# Patient Record
Sex: Female | Born: 1990 | Race: White | Marital: Single | Smoking: Never smoker
Health system: Northeastern US, Academic
[De-identification: ages and names within clinical notes are randomized; demographics above are authoritative.]

## PROBLEM LIST (undated history)

## (undated) DIAGNOSIS — J45909 Unspecified asthma, uncomplicated: Secondary | ICD-10-CM

## (undated) DIAGNOSIS — B009 Herpesviral infection, unspecified: Secondary | ICD-10-CM

## (undated) HISTORY — DX: Herpesviral infection, unspecified: B00.9

## (undated) HISTORY — DX: Unspecified asthma, uncomplicated: J45.909

---

## 2013-09-03 ENCOUNTER — Emergency Department
Admit: 2013-09-03 | Discharge: 2013-09-03 | Disposition: A | Discharge status: Home / Self Care | Payer: Self-pay | Source: Ambulatory Visit | Attending: Family Medicine | Admitting: Family Medicine

## 2013-09-03 DIAGNOSIS — S62309A Unspecified fracture of unspecified metacarpal bone, initial encounter for closed fracture: Secondary | ICD-10-CM

## 2013-09-03 LAB — POCT URINE PREGNANCY: Lot #: 700939

## 2013-09-03 LAB — HM HIV SCREENING OFFERED

## 2013-09-03 MED ORDER — IBUPROFEN 200 MG PO TABS *I*
800.0000 mg | ORAL_TABLET | Freq: Once | ORAL | Status: AC
Start: 2013-09-03 — End: 2013-09-03
  Administered 2013-09-03: 800 mg via ORAL
  Filled 2013-09-03: qty 4

## 2013-09-03 MED ORDER — IBUPROFEN 800 MG PO TABS *I*
800.0000 mg | ORAL_TABLET | Freq: Three times a day (TID) | ORAL | Status: DC | PRN
Start: 2013-09-03 — End: 2018-03-01

## 2013-09-03 NOTE — ED Provider Notes (Addendum)
History     Chief Complaint   Patient presents with   . Wrist Injury     HPI Comments: Larey Seat in dorm room last night. GLF after patient tripped. No head struck or LOC. Patient now c/o right wrist pain.patient took ibuprofen 7 hours PTA. Patient denies numbness or weakness of RUE.      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         No past surgical history on file.    No family history on file.      Social History      reports that she does not drink alcohol or use illicit drugs. Her tobacco and sexual activity histories are not on file.    Living Situation    Questions Responses    Patient lives with Other(comment)    Homeless No    Caregiver for other family member No    External Services None    Employment Student    Domestic Violence Risk           Problem List     There is no problem list on file for this patient.      Review of Systems   Review of Systems   Constitutional: Negative.    HENT: Negative.    Eyes: Negative.    Respiratory: Negative.    Cardiovascular: Negative.    Gastrointestinal: Negative.    Endocrine: Negative.    Musculoskeletal: Positive for joint swelling and arthralgias. Negative for myalgias, back pain and gait problem.   Skin: Negative.    Allergic/Immunologic: Negative.    Neurological: Negative.    Hematological: Negative.    Psychiatric/Behavioral: Negative.        Physical Exam     ED Triage Vitals   BP Heart Rate Heart Rate(via Pulse Ox) Resp Temp Temp Source SpO2 O2 Device O2 Flow Rate   09/03/13 1117 09/03/13 1117 -- 09/03/13 1117 09/03/13 1117 09/03/13 1117 09/03/13 1117 09/03/13 1117 --   116/74 mmHg 86  16 36.5 C (97.7 F) TEMPORAL 97 % None (Room air)       Weight           09/03/13 1117           58.06 kg (128 lb)               Physical Exam   Nursing note and vitals reviewed.  Constitutional: She is oriented to person, place, and time.  She appears well-developed and well-nourished. She appears distressed.   In painful distress.   HENT:   Head: Normocephalic and atraumatic.   Mouth/Throat: Oropharynx is clear and moist.   Eyes: Conjunctivae and EOM are normal. Pupils are equal, round, and reactive to light.   Neck: Normal range of motion. Neck supple.   Cardiovascular: Normal rate, regular rhythm, normal heart sounds and intact distal pulses.    Pulmonary/Chest: Effort normal and breath sounds normal.   Abdominal: Soft. Bowel sounds are normal.   Musculoskeletal: She exhibits tenderness. She exhibits no edema.   AROM normal at right shoulder,right elbow.  Decreased AROM at right wrist due to pain.  Right wrist swelling, no deformity, no snuffbox TTP. Cap refill <2sec. RUE neurovascularly intact.   Neurological: She is alert and oriented to person, place, and time. She has normal reflexes.  She displays normal reflexes. She exhibits normal muscle tone.   Skin: Skin is warm and dry. No rash noted. She is not diaphoretic. No erythema. No pallor.   Psychiatric: She has a normal mood and affect. Her behavior is normal. Judgment and thought content normal.       Medical Decision Making      Amount and/or Complexity of Data Reviewed  Tests in the radiology section of CPT: ordered and reviewed        Initial Evaluation:  ED First Provider Contact    Date/Time Event User Comments    09/03/13 1124 ED Provider First Contact Michaele Amundson Initial Face to Face Provider Contact          Patient seen by me as above    Assessment:  22 y.o., female comes to the ED s/p fall in dorm room last night. GLF after patient tripped. No head struck or LOC. Patient now c/o right wrist pain.patient took ibuprofen 7 hours PTA. Patient denies numbness or weakness of RUE.      Differential Diagnosis includes right fifth metacarpal fracture.       ulnar gutter splint applied. RUE neurovascularly intact post procedure.       Plan: diagnostics, analgesics, splinting.      Cruzita Lederer, MD           Cruzita Lederer, MD  09/03/13 1319    Cruzita Lederer, MD  09/03/13 1319    Cruzita Lederer, MD  09/03/13 1320

## 2013-09-03 NOTE — Discharge Instructions (Signed)
Please keep the splint intact and dry until follow up with orthopedic surgeon.  Please keep the splint on until follow up with orthopedic surgeon.

## 2013-09-03 NOTE — ED Notes (Signed)
Pt with c/o rt wrist pain.  Pt fell on tiled flooring last night landing on arm.  Splint intact on arrival ice applied last night took ibuprofen for pain at 0400 today 400mg .

## 2013-09-07 ENCOUNTER — Encounter: Payer: Self-pay | Admitting: Gastroenterology

## 2013-09-07 ENCOUNTER — Ambulatory Visit: Payer: Self-pay | Admitting: Orthopedic Surgery

## 2013-09-07 ENCOUNTER — Encounter: Payer: Self-pay | Admitting: Orthopedic Surgery

## 2013-09-07 VITALS — BP 116/70 | Ht 61.5 in | Wt 134.0 lb

## 2013-09-07 DIAGNOSIS — S92353A Displaced fracture of fifth metatarsal bone, unspecified foot, initial encounter for closed fracture: Secondary | ICD-10-CM

## 2013-09-11 NOTE — Progress Notes (Addendum)
PATIENTEUDELIA, CAHOON  MR #:  [id number]   ACCOUNT #:  [id number] DOB:  1991-01-31   DICTATED BY:  Neomia Dear, MD DATE OF VISIT:  09/07/2013     CHIEF COMPLAINT:  Hand pain.    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  Seen in the emergency room.  Imaging demonstrating a fracture of the 5th metacarpal without significant angulation or translation, and she comes to clinic today in a splint.     Past medical history, medications, allergies, past surgical history, social history, family history, review of systems, please see new patient questionnaire reviewed today.    PHYSICAL EXAMINATION:  Well-appearing young female in no acute distress.  Examination of her hand demonstrates tenderness to palpation over the distal aspect of the 5th metacarpal.  No pain about her wrist.  .  Skin is intact.  Brisk capillary refill in all her fingers.  Sensation to light touch is intact throughout her fingers.  No rotational deformity about her small finger.    IMAGING:  Minimally displaced distal 5th metacarpal fracture.    PLAN:  Boxer's cast.  Follow up 4 weeks.             ______________________________  Neomia Dear, MD    JCG/MODL  DD:  09/11/2013 06:34:24  DT:  09/11/2013 06:44:35  Job #:  [id number]    cc: Neomia Dear, MD

## 2013-10-19 ENCOUNTER — Encounter: Payer: Self-pay | Admitting: Orthopedic Surgery

## 2013-10-19 ENCOUNTER — Ambulatory Visit: Payer: Self-pay | Admitting: Orthopedic Surgery

## 2013-10-19 VITALS — Ht 61.5 in | Wt 134.0 lb

## 2013-10-19 DIAGNOSIS — S62309A Unspecified fracture of unspecified metacarpal bone, initial encounter for closed fracture: Secondary | ICD-10-CM

## 2013-11-11 NOTE — Progress Notes (Signed)
CHIEF COMPLAINT: Hand pain.   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 Cast removed today.  Past medical history, medications, allergies, past surgical history, social history, family history, review of systems. No change    PHYSICAL EXAMINATION: Well-appearing young female in no acute distress. Examination of her hand demonstrates no tenderness to palpation over the distal aspect of the 5th metacarpal. No pain about her wrist. . Skin is intact. Brisk capillary refill in all her fingers. Sensation to light touch is intact throughout her fingers. No rotational deformity about her small finger.     IMAGING: Minimally displaced distal 5th metacarpal fracture with interval healing.    PLAN: Discontinue cast. Return to activities as tolerated. If she notes and prolonged pain or stiffness she will contact my office. Follow up as needed.

## 2013-11-16 ENCOUNTER — Ambulatory Visit: Payer: Self-pay | Admitting: Orthopedic Surgery

## 2016-04-26 ENCOUNTER — Ambulatory Visit
Admission: AD | Admit: 2016-04-26 | Discharge: 2016-04-26 | Disposition: A | Discharge status: Home / Self Care | Payer: Self-pay

## 2016-04-26 DIAGNOSIS — N390 Urinary tract infection, site not specified: Secondary | ICD-10-CM

## 2016-04-26 LAB — POCT URINALYSIS DIPSTICK
Bilirubin,Ur: NEGATIVE
Glucose,UA POCT: NORMAL
Ketones,UA POCT: NEGATIVE
Leuk Esterase,UA POCT: 2 — AB
Lot #: 16608901
Nitrite,UA POCT: NEGATIVE
PH,UA POCT: 5 (ref 5–8)
Specific gravity,UA POCT: 1.02 (ref 1.002–1.03)
Urobilinogen,UA: NORMAL

## 2016-04-26 LAB — POCT URINE PREGNANCY: Lot #: 170601

## 2016-04-26 MED ORDER — NITROFURANTOIN MONOHYD MACRO 100 MG PO CAPS *I*
100.0000 mg | ORAL_CAPSULE | Freq: Two times a day (BID) | ORAL | 0 refills | Status: AC
Start: 2016-04-26 — End: 2016-05-01

## 2016-04-26 NOTE — Discharge Instructions (Signed)
You may take Tylenol 650 mg every 4-6 hours as needed for pain/fever.  Maximum daily dose = 3000 mg.  You may use over-the-counter AZO for dysuria relief.  If you were prescribed antibiotics, take them exactly as your caregiver instructs you. Finish the medication even if you feel better after you have only taken some of the medication.  Drink enough water and fluids to keep your urine clear or pale yellow, adding cranberry juice to your fluid intake to help eradicate urinary infection.  Avoid caffeine, tea, and carbonated beverages. They tend to irritate your bladder.  Empty your bladder often. Avoid holding urine for long periods of time.  Empty your bladder before and after sexual intercourse.  After a bowel movement, women should cleanse from front to back. Use each tissue only once.  Please consume 1-2 OTC probiotic yogurt products daily while on this extended antibiotic regimen to help prevent antibiotic-associated yeast infection

## 2016-04-26 NOTE — ED Triage Notes (Signed)
Patient presents with urinary frequency and dysuria x 1 day.    Triage Note   Jensine Luz, RN

## 2016-04-26 NOTE — UC Provider Note (Signed)
History     Chief Complaint   Patient presents with    Urinary Frequency     Patient presents with urinary frequency and dysuria x 1 day.      HPI Comments: Patient is a 25 year old female presents with UTI symptoms 1 day.  She states she's been having burning dysuria with a 1 day, and mild superpubic pressure/discomfort.  She denies any fever/chills, nausea/vomiting/diarrhea, genital rash, vaginal pain/odor/discharge, back pain.  She states she noticed scant hematuria today when she urinated.  Her LMP was last week.      History provided by:  Patient  Language interpreter used: No    Is this ED visit related to civilian activity for income:  Not work related      History reviewed. No pertinent past medical history.         History reviewed. No pertinent surgical history.    History reviewed. No pertinent family history.      Social History    reports that she has never smoked. She has never used smokeless tobacco. She reports that she does not drink alcohol or use illicit drugs. Her sexual activity history is not on file.    Living Situation     Questions Responses    Patient lives with Other(comment)    Homeless No    Caregiver for other family member No    External Services None    Employment Student    Domestic Violence Risk           Review of Systems   Review of Systems   Constitutional: Negative for activity change, appetite change, chills, fatigue and fever.   Respiratory: Negative for shortness of breath.    Cardiovascular: Negative for chest pain.   Gastrointestinal: Positive for abdominal pain. Negative for diarrhea, nausea and vomiting.   Genitourinary: Positive for dysuria, frequency, hematuria and urgency. Negative for difficulty urinating, dyspareunia, flank pain, genital sores, pelvic pain, vaginal bleeding, vaginal discharge and vaginal pain.   Musculoskeletal: Negative for back pain and myalgias.   Skin: Negative for rash.   Neurological: Negative for dizziness and light-headedness.    Hematological: Negative for adenopathy.       Physical Exam     ED Triage Vitals   BP Heart Rate Heart Rate (via Pulse Ox) Resp Temp Temp src SpO2 O2 Device O2 Flow Rate   04/26/16 2022 04/26/16 2022 04/26/16 2022 04/26/16 2022 04/26/16 2022 04/26/16 2022 04/26/16 2022 04/26/16 2022 --   122/77 78 78 18 36.8 C (98.2 F) Tympanic 99 % None (Room air)       Weight           --                               Physical Exam   Constitutional: She is oriented to person, place, and time. She appears well-developed and well-nourished. She does not appear ill. No distress.   HENT:   Head: Normocephalic and atraumatic.   Neck: Normal range of motion. Neck supple.   Cardiovascular: Normal rate, regular rhythm, normal heart sounds and intact distal pulses.    Pulmonary/Chest: Effort normal and breath sounds normal. No respiratory distress.   Abdominal: Soft. Normal appearance and bowel sounds are normal. She exhibits no distension. There is no hepatosplenomegaly. There is tenderness (dull discomfort) in the suprapubic area. There is no rigidity, no rebound, no guarding and no CVA tenderness.  Musculoskeletal: Normal range of motion.   Neurological: She is alert and oriented to person, place, and time.   Skin: Skin is warm and dry.   Psychiatric: She has a normal mood and affect.   Nursing note and vitals reviewed.       Medical Decision Making      Amount and/or Complexity of Data Reviewed  Clinical lab tests: ordered and reviewed        Initial Evaluation:  ED First Provider Contact     Date/Time Event User Comments    04/26/16 2026 ED Provider First Contact Yonas Bunda Initial Face to Face Provider Contact          Patient seen by me on arrival date of 04/26/2016 at at time of arrival  8:05 PM.  Initial face to face evaluation time noted above may be discrepant due to patient acuity and delay in documentation.    Assessment:  25 y.o.female [facility] with UTI symptoms 1 day.  Patient is afebrile with  stable vital signs at clinic.    Differential Diagnosis includes   Lower Urinary Tract Infection  Acute cystitis  Acute Pyelonephritis  Hyperactive bladder with Urethral irritation  Acute Nephrolithiasis                  Plan:   Orders Placed This Encounter    Urine Culture    POCT urinalysis dipstick    POCT urine pregnancy    nitrofurantoin monohydrate macrocrystal (MACROBID) 100 MG capsule       Recent Results (from the past 24 hour(s))   POCT urinalysis dipstick    Collection Time: 04/26/16  8:26 PM   Result Value Ref Range    Specific gravity,UA POCT 1.020 1.002 - 1.03    PH,UA POCT 5.0 5 - 8    Leuk Esterase,UA POCT +2 (!) Negative    Nitrite,UA POCT Negative Negative    Protein,UA POCT Trace (!) Negative mg/dL    Glucose,UA POCT Normal Normal    Ketones,UA POCT Negative Negative    Urobilinogen,UA Normal     Bilirubin,Ur Negative Negative    Blood,UA POCT About 250 (!) Negative    Exp date 12-2016     Lot # [id number]    POCT urine pregnancy    Collection Time: 04/26/16  8:56 PM   Result Value Ref Range    Preg Test,UR POC  Negative-Dilute urine specimens may cause false negative urine pregnancy results...     Negative-Dilute urine specimens may cause false negative urine pregnancy results...    INTERNAL CONTROL POCT URINE PREGNANCY *Yes-internal procedural control(s) acceptable     Exp date 07-2017     Lot # [id number]        Pregnancy - Urine Pregnancy test ordered to guide diagnostics and/or medication therapy in patient of child bearing age    Urine culture ordered and sent to lab.      Final Diagnosis    ICD-10-CM ICD-9-CM   1. Urinary tract infection, site unspecified N39.0      URINARY TRACT INFECTION, ADULT (ENGLISH) View    Patient's Language: English   Instructions      You may take Tylenol 650 mg every 4-6 hours as needed for pain/fever. Maximum daily dose = 3000 mg.  You may use over-the-counter AZO for dysuria relief.  If you were prescribed antibiotics, take them exactly as your caregiver instructs  you. Finish the medication even if you feel better after you have  only taken some of the medication.  Drink enough water and fluids to keep your urine clear or pale yellow, adding cranberry juice to your fluid intake to help eradicate urinary infection.  Avoid caffeine, tea, and carbonated beverages. They tend to irritate your bladder.  Empty your bladder often. Avoid holding urine for long periods of time.  Empty your bladder before and after sexual intercourse.  After a bowel movement, women should cleanse from front to back. Use each tissue only once.  Please consume 1-2 OTC probiotic yogurt products daily while on this extended antibiotic regimen to help prevent antibiotic-associated yeast infection          Encourage fluids, encourage rest, good hand hygiene.    Use over the counter medications as discussed.    Please start the new medications as below:    Current Discharge Medication List      New Medications    Details Last Dose Given Next Dose Due Script Given?   nitrofurantoin monohydrate macrocrystal (MACROBID) 100 mg Dose: 100 mg  Take 100 mg by mouth 2 times daily    Quantity 10 capsule, Refill 0  Start date: 04/26/2016, End date: 05/01/2016       Comments: Emergency Encounter                   Please follow up with your physician as below:    Follow-up Information     Follow up with Provider, None. Go in 1 week.    Why:  If symptoms worsen or persist/do not improve, For further   evaluation and treatment    Contact information:    [address]  [address]  [place] [address]          If short of breath, chest pains or any other concerns please report to the emergency room.    In the event of an Emergency dial 911.      Final Diagnosis  Final diagnoses:   [N39.0] Urinary tract infection, site unspecified (Primary)           Merrily Brittle, PA    Supervising physician Dr. Delbert Harness   was immediately available     Merrily Brittle, [place]  04/26/16 2058

## 2016-05-01 LAB — AEROBIC CULTURE

## 2017-02-02 ENCOUNTER — Ambulatory Visit
Admission: AD | Admit: 2017-02-02 | Discharge: 2017-02-02 | Disposition: A | Discharge status: Home / Self Care | Payer: PRIVATE HEALTH INSURANCE | Source: Ambulatory Visit | Attending: Emergency Medicine | Admitting: Emergency Medicine

## 2017-02-02 DIAGNOSIS — M545 Low back pain, unspecified: Secondary | ICD-10-CM

## 2017-02-02 DIAGNOSIS — R319 Hematuria, unspecified: Secondary | ICD-10-CM | POA: Insufficient documentation

## 2017-02-02 DIAGNOSIS — Z3202 Encounter for pregnancy test, result negative: Secondary | ICD-10-CM

## 2017-02-02 DIAGNOSIS — R140 Abdominal distension (gaseous): Secondary | ICD-10-CM | POA: Insufficient documentation

## 2017-02-02 LAB — POCT URINALYSIS DIPSTICK
Bilirubin,Ur: NEGATIVE
Glucose,UA POCT: NORMAL mg/dL
Ketones,UA POCT: NEGATIVE mg/dL
Leuk Esterase,UA POCT: NEGATIVE
Lot #: 22262702
Nitrite,UA POCT: NEGATIVE
PH,UA POCT: 8 (ref 5–8)
Protein,UA POCT: NEGATIVE mg/dL
Specific gravity,UA POCT: 1 (ref 1.002–1.030)
Urobilinogen,UA: NORMAL mg/dL

## 2017-02-02 LAB — POCT URINE PREGNANCY: Lot #: 171461

## 2017-02-02 LAB — PREGNANCY TEST, SERUM: Preg,Serum: NEGATIVE

## 2017-02-02 NOTE — ED Notes (Signed)
Reviewed discharge paperwork with patient. All questioned answered, patient verbalized understanding. Patient left with all belongings.

## 2017-02-02 NOTE — Discharge Instructions (Signed)
Urine culture ordered and sent to lab.  Drink plenty of fluids, monitor symptoms for changes.   Apply heat to low back.  Any worsening of symptoms go directly to ED for further evaluation and treatment.    PLEASE REVIEW ALL INSTRUCTIONS FOR DETAILS AND CONTENT CONTAINED IN THE DISCHARGE MATERIALS NOT COVERED AT DISCHARGE.    Thank you Mackenzie Daniels for coming to [facility] for your health care concerns.    If your condition changes and/or worsens please follow up with your primary doctor and/or return to the urgent care center.    In the event of an emergency please dial 911.

## 2017-02-02 NOTE — UC Provider Note (Signed)
History     Chief Complaint   Patient presents with    Hematuria     Pt presents with two day history of lower back pain and then hematuria. Also reports bloating and abdominal pain.     Back Pain    Abdominal Pain     HPI Comments: 26 year old female presents for evaluation of lower back pain and hematuria which started 2 days ago.  Patient also reports abdominal bloating and discomfort across her entire lower abdomen.  She reports mild nausea but no vomiting.  She is also had mild diarrhea over the past 2 days.  She denies any symptoms of dysuria, increased frequency or urgency.  No systemic symptoms such as fever, chills, or malaise.  Denies possibility pregnancy and states she had her last period March 10th.  She states she is usually pretty regular.  Patient had a past pregnancy at the age of 3 which was terminated.      History provided by:  Patient      History reviewed. No pertinent past medical history.         History reviewed. No pertinent surgical history.    History reviewed. No pertinent family history.      Social History    reports that she has never smoked. She has never used smokeless tobacco. She reports that she does not drink alcohol or use illicit drugs. Her sexual activity history is not on file.    Living Situation     Questions Responses    Patient lives with Other(comment)    Homeless No    Caregiver for other family member No    External Services None    Employment Student    Domestic Violence Risk           Review of Systems   Review of Systems   Constitutional: Negative for activity change, chills, fatigue and fever.   Respiratory: Negative for cough and shortness of breath.    Cardiovascular: Negative for chest pain.   Gastrointestinal: Positive for abdominal distention (bloated feeling), abdominal pain, diarrhea and nausea. Negative for constipation and vomiting.   Genitourinary: Positive for difficulty urinating and hematuria. Negative for decreased urine volume, dysuria,  flank pain, frequency, menstrual problem, pelvic pain, urgency, vaginal bleeding, vaginal discharge and vaginal pain.   Musculoskeletal: Positive for back pain.   Skin: Negative for rash.   Neurological: Negative for dizziness, light-headedness and headaches.   Psychiatric/Behavioral: Negative for agitation and behavioral problems.       Physical Exam       ED Triage Vitals   BP Heart Rate Heart Rate (via Pulse Ox) Resp Temp Temp src SpO2 (Retired) O2 Device O2 Flow Rate   02/02/17 1130 02/02/17 1130 -- 02/02/17 1130 02/02/17 1130 02/02/17 1130 02/02/17 1130 -- --   119/73 67  18 36.7 C (98.1 F) TEMPORAL 100 %        Weight           --                               Physical Exam   Constitutional: She is oriented to person, place, and time. She appears well-developed and well-nourished.   HENT:   Head: Normocephalic and atraumatic.   Eyes: Conjunctivae are normal.   Cardiovascular: Normal heart sounds.    Pulmonary/Chest: Effort normal.   Abdominal: Soft. Bowel sounds are normal. She exhibits no distension and  no mass. There is no hepatosplenomegaly. There is tenderness in the right lower quadrant, suprapubic area and left lower quadrant. There is no rigidity, no rebound, no guarding, no CVA tenderness, no tenderness at McBurney's point and negative Murphy's sign. No hernia.   Musculoskeletal: Normal range of motion.   Neurological: She is alert and oriented to person, place, and time.   Skin: Skin is warm and dry.   Psychiatric: She has a normal mood and affect. Her behavior is normal. Judgment and thought content normal.   Nursing note and vitals reviewed.       Medical Decision Making      Amount and/or Complexity of Data Reviewed  Clinical lab tests: ordered and reviewed      Pregnancy - Urine Pregnancy test ordered to guide diagnostics and/or medication therapy in patient of child bearing age    Initial Evaluation:  ED First Provider Contact     Date/Time Event User Comments    02/02/17 1134 ED First Provider  Contact Alroy Bailiff Initial Face to Face Provider Contact          Patient was seen on: 02/02/2017        Assessment:  25 y.o.female comes to the Urgent Care Center with low back pain and hematuria 2 days.  Hematuria has resolved however she continues to have diffuse discomfort across the lower back.  Patient also feels abdominal discomfort and bloating in the lower quadrants.  Urinalysis shows trace blood only.    Negative pregnancy however dilute sample.  We will perform serum hCG test.   Differential Diagnosis includes pregnancy  Lower Urinary Tract Infection  Acute cystitis  Acute Pyelonephritis  Hyperactive bladder with Urethral irritation  Acute Nephrolithiasis                    Plan: Dipstick urinalysis, urine culture, pregnancy test, serum hCG test.  Patient referred to [facility] for further evaluation and treatment.  Orders Placed This Encounter    Bacterial urine culture    HCG, serum qualitative, pregnancy    ED/UC REFERRAL TO OBGYN    POCT urinalysis dipstick    POCT urine pregnancy       Recent Results (from the past 24 hour(s))   POCT urine pregnancy    Collection Time: 02/02/17 11:24 AM   Result Value Ref Range    Preg Test,UR POC  Negative-Dilute urine specimens may cause false negative urine pregnancy results...     Negative-Dilute urine specimens may cause false negative urine pregnancy results...    INTERNAL CONTROL POCT URINE PREGNANCY *Yes-internal procedural control(s) acceptable     Exp date 12/2017     Lot # [id number]    POCT urinalysis dipstick    Collection Time: 02/02/17 11:25 AM   Result Value Ref Range    Specific gravity,UA POCT 1.000 1.002 - 1.030    PH,UA POCT 8.0 5 - 8    Leuk Esterase,UA POCT Negative Negative    Nitrite,UA POCT Negative Negative    Protein,UA POCT Negative Negative mg/dL    Glucose,UA POCT Normal Normal mg/dL    Ketones,UA POCT Negative Negative mg/dL    Urobilinogen,UA Normal Less than 1 mg/dL    Bilirubin,Ur Negative Negative    Blood,UA POCT Trace (!) Negative     Exp date 09/2017     Lot # [id number]            Final Diagnosis    ICD-10-CM ICD-9-CM   1. Acute low back pain  without sciatica, unspecified back pain laterality M54.5 724.2   2. Hematuria, unspecified type R31.9 599.70   3. Abdominal bloating R14.0 787.3       Encourage fluids, encourage rest, good hand hygiene.    Use over the counter medications as discussed.    Please start the new medications as below:    Current Discharge Medication List          Please follow up with your physician as below:    Follow-up Information     [facility]    Specialty:  Emergency Medicine    Why:  As needed, If symptoms worsen    Contact information:    [address]  [address]  [place] [id number]  [phone]        Thank you Ulice Dashicole Maggio for coming to [facility] for your health care concerns.    If your condition changes and/or worsens please follow up with her primary doctor and/or return to the urgent care center.    If short of breath, chest pains or any other concerns please report to the emergency room.    In the event of an Emergency dial 911.    Final Diagnosis  Final diagnoses:   [M54.5] Acute low back pain without sciatica, unspecified back pain laterality (Primary)   [R31.9] Hematuria, unspecified type   [R14.0] Abdominal bloating           Lonie PeakLINTON P Revere Maahs, PA    Supervising physician Harless LittenMichael Kamali, MD was immediately available     Olen PelOlsen, Amiya Escamilla Peter, PA  02/02/17 1158

## 2017-02-02 NOTE — ED Triage Notes (Signed)
Pt presents with two day history of lower back pain and then hematuria. Also reports bloating and abdominal pain.        Triage Note   Dedra SkeensLauren Virgene Tirone, RN

## 2017-02-03 ENCOUNTER — Telehealth: Payer: Self-pay

## 2017-02-03 LAB — AEROBIC CULTURE: Aerobic Culture: 0

## 2017-02-03 NOTE — Telephone Encounter (Signed)
Pt seen at Urgent care on 3/27 for low back pain and 1-2 days vaginal bleeding.  Pt was worked up for UTI and pregnancy. Serum pregnancy test negative.  Urine positive for trace blood, culture w <10,000/ml of normal flora.    Pt still having pain and bloating.  Managing with OTC tylenol/ibuprofen. Pt is at work and requesting a late the day appt to accommodate her work schedule.     [place] in [place] and does not have establish care providers in the area.     Appt booked for 3/30 at pt's request.      Pt with questions regarding insurance acceptance. Will route for [facility] to contact pt.

## 2017-02-03 NOTE — Telephone Encounter (Signed)
[facility] is requesting her lab results 1st before she schedules NPV/UC F/U.

## 2017-02-03 NOTE — Telephone Encounter (Signed)
Patient had called earlier regarding her test results, writer was able to call patient back. Two patient identifiers were used to verify patient identity. Patient was given her test results. She verbalized understanding and denies further questions/concerns at this time. Bonnee QuinKeri Anab Vivar, RN

## 2017-02-05 ENCOUNTER — Ambulatory Visit: Payer: PRIVATE HEALTH INSURANCE | Attending: Obstetrics and Gynecology | Admitting: Obstetrics and Gynecology

## 2017-02-05 ENCOUNTER — Encounter: Payer: Self-pay | Admitting: Obstetrics and Gynecology

## 2017-02-05 VITALS — BP 140/80 | HR 84 | Ht 61.0 in | Wt 169.0 lb

## 2017-02-05 DIAGNOSIS — Z01419 Encounter for gynecological examination (general) (routine) without abnormal findings: Secondary | ICD-10-CM | POA: Insufficient documentation

## 2017-02-05 DIAGNOSIS — Z113 Encounter for screening for infections with a predominantly sexual mode of transmission: Secondary | ICD-10-CM | POA: Insufficient documentation

## 2017-02-05 NOTE — Progress Notes (Signed)
Subjective:      Mackenzie Daniels is a 26 y.o. premenopausal female who presents for a new patient exam. She was seen in urgent care on 3/27 for 1 episode of vaginal spotting and lower back pain. She is in a monogamous relationship and uses condoms for birth control. The spotting concerned her. She tested negative for pregnancy and UTI at that time.  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 She is happy with this and wishes to continue. She is not interested in any form of Birth Control and will continue with Condoms.     GYN History  LMP: 01/14/2017  Menses:regular every month without intermenstrual spotting  Cramps: Moderate  Menarche: 26yo: [place]: 26yo  Last pap smear: Date: 2015 Results: no abnormalities  History of abnormal pap smear: No.   Sexually active: single partner, contraception - condoms: 100% of the time  Together with partner: 2 years  STD History: HSV, chlamydia  Current contraception: condoms: 100% of the time      Obstetric History    G1   P0   T0   P0   A1   L0     SAB0   TAB1   Ectopic0   Multiple0   Live Births0        Screening History:  Gyn screening history: last pap: was normal  The patient wears seatbelts:yes  The patient participates in regular exercise: no   Diet: is adequate  Has the patient ever been transfused or tattooed?:no  Lives with has a roommate  Denies depression.   Domestic violence:No    Mackenzie Daniels  has a past medical history of Asthma and Herpes simplex virus (HSV) infection.  Mackenzie Daniels  does not have a problem list on file.  Mackenzie Daniels  has no past surgical history on file.    Review of Systems  Pertinent items are noted in HPI.      Objective:      BP 140/80   Pulse 84   Ht 1.549 m ( )   Wt 76.7 kg (169 lb)   LMP 01/14/2017   BMI 31.93 kg/m2  General appearance: alert, appears stated age, cooperative, no distress and mildly obese  Breasts: normal appearance, no masses or tenderness  Abdomen: soft, non-tender; bowel sounds normal; no masses,  no  organomegaly and obese  Pelvic: cervix normal in appearance, external genitalia normal, no adnexal masses or tenderness, no cervical motion tenderness, rectovaginal septum normal, uterus normal size, shape, and consistency and vagina normal without discharge    Assessment:      Mackenzie Daniels is a 26 y.o. female who presents for an annual exam. She has the following concerns: 1) one episode of intermenstrual spotting 2) needs to establish care 3) STI screen 4) back pain     Plan:   PAP: today   Ok to try Ibuprofen  PO every 6-8 hours for lower back pain.    Preventative health: Healthy women's lifestyle handout., Breast self exam technique reviewed and patient encouraged to perform self-exam monthly., Nutrition., Calcium. and Multivitamins.  Sexual health and contraception: The risks and benefits of different contraceptive methods were discussed with the patient., A contraceptive plan and follow up was discussed and understood by the patient., Planned contraception: condoms: 100% of the time, The risks and prevention of STD's were reviewed. and Safer sex practices were reviewed.  Nutrition/Exercise reviewed: Weight loss counseling. and Counseled on regular exercise. Discussed duration, frequency, intensity, and  types of exercise.  Smoking cessation: N/A  Vaccinations: Influenza    To set up appt with Dr. Levada Schilling to establish Primary Care  Orders Placed This Encounter   Procedures    Trichomonas DNA amplification     Standing Status:   Future     Standing Expiration Date:   02/05/2017    Chlamydia plasmid DNA amplification    N. Gonorrhoeae DNA amplification    Hepatitis C antibody     Standing Status:   Future     Standing Expiration Date:   02/05/2018    Hepatitis B surface antigen     Standing Status:   Future     Standing Expiration Date:   02/05/2018    GYN Cytology     Order Specific Question:   Source     Answer:   Cervix     Order Specific Question:   Screening or diagnostic     Answer:   Screening      Order Specific Question:   Clinical history     Answer:   new patient     Order Specific Question:   Patient Pregnant     Answer:   No     Order Specific Question:   HPV Testing (requested on Liquid based pap test only)     Answer:   No HPV     Order Specific Question:   STD Testing (Liquid based PAP test only)     Answer:   No STD Testing     Order Specific Question:   LMP     Answer:   01/14/2017    Syphilis Screen w Rfx to RPR and TPPA     Standing Status:   Future     Standing Expiration Date:   02/05/2018    CBC     Standing Status:   Future     Standing Expiration Date:   02/05/2018    TSH     Standing Status:   Future     Standing Expiration Date:   02/05/2018    HIV 1&2 antigen/antibody     Standing Status:   Future     Standing Expiration Date:   03/07/2018     Order Specific Question:   Discussion of HIV testing completed and verbal consent to test obtained from patient/proxy?     Answer:   Yes

## 2017-02-05 NOTE — Addendum Note (Signed)
Addended byPreston Fleeting on: 02/05/2017 04:59 PM     Modules accepted: Orders

## 2017-02-09 LAB — TRICHOMONAS DNA AMPLIFICATION: Trichomonas DNA amplification: 0

## 2017-02-09 LAB — CHLAMYDIA PLASMID DNA AMPLIFICATION: Chlamydia Plasmid DNA Amplification: 0

## 2017-02-09 LAB — N. GONORRHOEAE DNA AMPLIFICATION: N. gonorrhoeae DNA Amplification: 0

## 2017-02-10 LAB — GYN CYTOLOGY

## 2017-02-12 ENCOUNTER — Telehealth: Payer: Self-pay

## 2017-02-12 NOTE — Telephone Encounter (Signed)
Patient looking for lab results from 02/05/17. Let patient know that all of her vaginal cultures came back negative and her Pap was normal. Patient verbalized understanding and has no further questions at this time.

## 2017-02-12 NOTE — Telephone Encounter (Signed)
Pt calling for test results from 3/30.

## 2017-03-15 ENCOUNTER — Ambulatory Visit: Payer: PRIVATE HEALTH INSURANCE | Admitting: Internal Medicine

## 2017-07-27 ENCOUNTER — Encounter (HOSPITAL_COMMUNITY): Payer: Self-pay | Admitting: Emergency Medicine

## 2017-07-27 DIAGNOSIS — Z5321 Procedure and treatment not carried out due to patient leaving prior to being seen by health care provider: Secondary | ICD-10-CM | POA: Insufficient documentation

## 2017-07-27 LAB — CBC
HCT: 38.6 % (ref 36.0–46.0)
Hemoglobin: 12.9 g/dL (ref 12.0–15.0)
MCH: 27.7 pg (ref 26.0–34.0)
MCHC: 33.4 g/dL (ref 30.0–36.0)
MCV: 83 fL (ref 78.0–100.0)
PLATELETS: 300 10*3/uL (ref 150–400)
RBC: 4.65 MIL/uL (ref 3.87–5.11)
RDW: 14 % (ref 11.5–15.5)
WBC: 15.1 10*3/uL — AB (ref 4.0–10.5)

## 2017-07-27 LAB — COMPREHENSIVE METABOLIC PANEL
ALK PHOS: 132 U/L — AB (ref 38–126)
ALT: 31 U/L (ref 14–54)
ANION GAP: 10 (ref 5–15)
AST: 21 U/L (ref 15–41)
Albumin: 3.8 g/dL (ref 3.5–5.0)
BUN: 6 mg/dL (ref 6–20)
CALCIUM: 9.4 mg/dL (ref 8.9–10.3)
CO2: 23 mmol/L (ref 22–32)
CREATININE: 0.54 mg/dL (ref 0.44–1.00)
Chloride: 105 mmol/L (ref 101–111)
Glucose, Bld: 91 mg/dL (ref 65–99)
Potassium: 3.6 mmol/L (ref 3.5–5.1)
Sodium: 138 mmol/L (ref 135–145)
Total Bilirubin: 0.5 mg/dL (ref 0.3–1.2)
Total Protein: 7.2 g/dL (ref 6.5–8.1)

## 2017-07-27 LAB — LIPASE, BLOOD: LIPASE: 16 U/L (ref 11–51)

## 2017-07-27 LAB — I-STAT BETA HCG BLOOD, ED (MC, WL, AP ONLY)

## 2017-07-27 NOTE — ED Notes (Signed)
Notified EDP,Yao,MD., pt. I-stat beta hCG results positive.

## 2017-07-27 NOTE — ED Triage Notes (Signed)
Pt states that she hasn't had a period since march.  Pt states that she hasn't taken a pregnancy test.  She c/o intermittent low abd cramping.

## 2017-07-28 ENCOUNTER — Emergency Department (HOSPITAL_COMMUNITY)
Admission: EM | Admit: 2017-07-28 | Discharge: 2017-07-28 | Discharge status: Against Medical Advice | Payer: Self-pay | Attending: Emergency Medicine | Admitting: Emergency Medicine

## 2017-07-28 ENCOUNTER — Emergency Department (HOSPITAL_COMMUNITY): Payer: Self-pay

## 2017-07-28 DIAGNOSIS — R109 Unspecified abdominal pain: Secondary | ICD-10-CM

## 2017-07-28 DIAGNOSIS — O26899 Other specified pregnancy related conditions, unspecified trimester: Secondary | ICD-10-CM

## 2017-07-29 ENCOUNTER — Encounter (HOSPITAL_COMMUNITY): Payer: Self-pay | Admitting: Emergency Medicine

## 2017-07-29 ENCOUNTER — Emergency Department (HOSPITAL_COMMUNITY)
Admission: EM | Admit: 2017-07-29 | Discharge: 2017-07-29 | Disposition: A | Discharge status: Home / Self Care | Payer: Self-pay | Attending: Emergency Medicine | Admitting: Emergency Medicine

## 2017-07-29 DIAGNOSIS — Z3A20 20 weeks gestation of pregnancy: Secondary | ICD-10-CM | POA: Insufficient documentation

## 2017-07-29 MED ORDER — PRENATAL VITAMINS 28-0.8 MG PO TABS: ORAL_TABLET | ORAL | 1 refills | Status: AC

## 2017-07-29 NOTE — ED Provider Notes (Signed)
[facility]-EMERGENCY DEPT Provider Note   CSN: [id number] Arrival date & time: 07/29/17  1534     History   Chief Complaint Chief Complaint  Patient presents with  . Ultrasound Results    HPI Donna White is a 26 y.o. female.  Pt was seen here yesterday and had an ultrasound ordered.  Pt reports she left before results.  Pt reports she had to leave for work.  Pt denies any complaints.  Pt asking about results.  Pt had cramping yesterday.  No cramping today.  No prenatal care.    The history is provided by the patient. No language interpreter was used.    History reviewed. No pertinent past medical history.  There are no active problems to display for this patient.   History reviewed. No pertinent surgical history.  OB History    Gravida Para Term Preterm AB Living   1             SAB TAB Ectopic Multiple Live Births                   Home Medications    Prior to Admission medications   Medication Sig Start Date End Date Taking? Authorizing Provider  Prenatal Vit-Fe Fumarate-FA (PRENATAL VITAMINS) 28-0.8 MG TABS One a day 07/29/17   Elson Areas, PA-C    Family History No family history on file.  Social History Social History  Substance Use Topics  . Smoking status: Never Smoker  . Smokeless tobacco: Never Used  . Alcohol use No     Allergies   Patient has no known allergies.   Review of Systems Review of Systems  All other systems reviewed and are negative.    Physical Exam Updated Vital Signs BP 129/73   Pulse 80   Temp 98.4 F (36.9 C)   Resp 20   LMP 01/24/2017   SpO2 100%   Physical Exam  Constitutional: She appears well-developed and well-nourished. No distress.  HENT:  Head: Normocephalic and atraumatic.  Eyes: Conjunctivae are normal.  Neck: Neck supple.  Cardiovascular: Normal rate and regular rhythm.   No murmur heard. Pulmonary/Chest: Effort normal and breath sounds normal. No respiratory distress.  Abdominal: Soft. There  is no tenderness.  Musculoskeletal: She exhibits no edema.  Neurological: She is alert.  Skin: Skin is warm and dry.  Psychiatric: She has a normal mood and affect.  Nursing note and vitals reviewed.    ED Treatments / Results  Labs (all labs ordered are listed, but only abnormal results are displayed) Labs Reviewed - No data to display  EKG  EKG Interpretation None       Radiology US Ob Limited  Result Date: 07/28/2017 CLINICAL DATA:  Cramping for 3 months. No prenatal care. Estimated gestational age by LMP is 28 weeks 6 days. EXAM: LIMITED OBSTETRIC ULTRASOUND FINDINGS: Number of Fetuses: 1 Heart Rate:  173 bpm Movement: Fetal movement is observed. Presentation: Transverse to the left. Placental Location: Posterior Previa: No. Inferior placental tip measures 5.6 cm from the cervical os. Amniotic Fluid (Subjective): Within normal limits. Largest vertical pocket is measured at 4.8 cm. BPD:  4.5cm 19w  4d MATERNAL FINDINGS: Cervix:  Cervix appears closed.  Cervical length measures 5.2 cm Uterus/Adnexae: Limited visualization. No myometrial mass lesions are identified. Ovaries are not identified but no adnexal masses are shown in the images obtained. IMPRESSION: Single intrauterine pregnancy with transverse lie. No acute complication is demonstrated on limited ultrasound examination. This exam is performed  on an emergent basis and does not comprehensively evaluate fetal size, dating, or anatomy; follow-up complete OB US should be considered if further fetal assessment is warranted. Electronically Signed   By: Burman Nieves M.D.   On: 07/28/2017 01:19    Procedures Procedures (including critical care time)  Medications Ordered in ED Medications - No data to display   Initial Impression / Assessment and Plan / ED Course  I have reviewed the triage vital signs and the nursing notes.  Pertinent labs & imaging results that were available during my care of the patient were reviewed by  me and considered in my medical decision making (see chart for details).     Meds ordered this encounter  Medications  . Prenatal Vit-Fe Fumarate-FA (PRENATAL VITAMINS) 28-0.8 MG TABS    Sig: One a day    Dispense:  90 tablet    Refill:  1    Order Specific Question:   Supervising Provider    Answer:   Eber Hong [[id number]]    Final Clinical Impressions(s) / ED Diagnoses   Final diagnoses:  [redacted] weeks gestation of pregnancy    New Prescriptions Discharge Medication List as of 07/29/2017  6:03 PM    START taking these medications   Details  Prenatal Vit-Fe Fumarate-FA (PRENATAL VITAMINS) 28-0.8 MG TABS One a day, Print      Pt counseled on obtaining pregnancy medicaid and following up at [facility] for prenatal care An After Visit Summary was printed and given to the patient.    Elson Areas, PA-C 07/29/17 2030    Lavera Guise, MD 07/30/17 [phone]

## 2017-07-29 NOTE — ED Triage Notes (Signed)
Pt seen here on the 9/18 for abdominal pain, left related to having to go to work, here for [place] results. Denies complaint at this time.

## 2018-03-01 ENCOUNTER — Ambulatory Visit
Admission: AD | Admit: 2018-03-01 | Discharge: 2018-03-01 | Disposition: A | Discharge status: Home / Self Care | Payer: PRIVATE HEALTH INSURANCE | Source: Ambulatory Visit | Attending: Emergency Medicine | Admitting: Emergency Medicine

## 2018-03-01 DIAGNOSIS — R062 Wheezing: Secondary | ICD-10-CM | POA: Insufficient documentation

## 2018-03-01 DIAGNOSIS — J4530 Mild persistent asthma, uncomplicated: Secondary | ICD-10-CM | POA: Insufficient documentation

## 2018-03-01 MED ORDER — FLUTICASONE PROPIONATE (INHAL) 50 MCG/BLIST IN AEPB *A*
2.0000 | INHALATION_SPRAY | Freq: Two times a day (BID) | RESPIRATORY_TRACT | 0 refills | Status: AC
Start: 2018-03-01 — End: 2018-03-31

## 2018-03-01 MED ORDER — IPRATROPIUM BROMIDE 0.02 % IN SOLN *I*
500.0000 ug | Freq: Once | RESPIRATORY_TRACT | Status: AC
Start: 2018-03-01 — End: 2018-03-01
  Administered 2018-03-01: 10:00:00 500 ug via RESPIRATORY_TRACT

## 2018-03-01 MED ORDER — ALBUTEROL SULFATE HFA 108 (90 BASE) MCG/ACT IN AERS *I*
1.0000 | INHALATION_SPRAY | Freq: Four times a day (QID) | RESPIRATORY_TRACT | 0 refills | Status: AC | PRN
Start: 2018-03-01 — End: 2018-03-31

## 2018-03-01 MED ORDER — PREDNISONE 20 MG PO TABS *I*
40.0000 mg | ORAL_TABLET | Freq: Every day | ORAL | 0 refills | Status: AC
Start: 2018-03-01 — End: 2018-03-06

## 2018-03-01 MED ORDER — PREDNISONE 10 MG PO TABS *I*
60.0000 mg | ORAL_TABLET | Freq: Once | ORAL | Status: AC
Start: 2018-03-01 — End: 2018-03-01
  Administered 2018-03-01: 10:00:00 60 mg via ORAL

## 2018-03-01 MED ORDER — ALBUTEROL SULFATE (2.5 MG/3ML) 0.083% IN NEBU *I*
2.5000 mg | INHALATION_SOLUTION | Freq: Once | RESPIRATORY_TRACT | Status: AC
Start: 2018-03-01 — End: 2018-03-01
  Administered 2018-03-01: 2.5 mg via RESPIRATORY_TRACT

## 2018-03-01 NOTE — [facility] Provider Note (Signed)
History     Chief Complaint   Patient presents with    Asthma     "My asthma has been had over the last two weeks, I'm waking up over night to use my inhaler"     Patient is a 27 year old female with past medical history including asthma for which she uses an albuterol inhaler and takes Singulair daily presenting for concerns of worsening asthma.  Patient states that over the past 4 nights she has had to wake up multiple times to use her albuterol inhaler due to chest tightness, shortness of breath and wheezing.  Patient states that she typically uses her albuterol inhaler every day for the past year as well as taking Singulair.  She notes that she also occasionally uses it before exercise or during exercise.  She is denying any URI symptoms, states that she is wondering if the change in weather is affecting her asthma.  Today she is complaining of shortness of breath, wheezing and chest tightness despite multiple use of albuterol inhaler        History provided by:  Patient  Language interpreter used: No        Medical/Surgical/Family History     Past Medical History:   Diagnosis Date    Asthma     Herpes simplex virus (HSV) infection         There is no problem list on file for this patient.           History reviewed. No pertinent surgical history.  Family History   Problem Relation Age of Onset    Cancer Paternal Grandfather     Colon cancer Paternal Grandmother     Cancer Maternal Grandmother     Cancer Maternal Grandfather     Diabetes Father     Cancer Sister     Hypertension Neg Hx     Ovarian cancer Neg Hx     Breast cancer Neg Hx     Stroke Neg Hx     Thrombosis Neg Hx           Social History   Substance Use Topics    Smoking status: Never Smoker    Smokeless tobacco: Never Used    Alcohol use Yes     Living Situation     Questions Responses    Patient lives with Other(comment)    Homeless No    Caregiver for other family member No    External Services None    Employment Student     Domestic Violence Risk                 Review of Systems   Review of Systems   Constitutional: Negative.    HENT: Negative.    Eyes: Negative.    Respiratory: Positive for chest tightness, shortness of breath and wheezing.    Cardiovascular: Negative.    Gastrointestinal: Negative.    Endocrine: Negative.    Genitourinary: Negative.    Musculoskeletal: Negative.    Skin: Negative.    Neurological: Negative.        Physical Exam   Triage Vitals  Triage Start: Start, (03/01/18 0950)   First Recorded BP: 112/64, Resp: 18, Temp: 36.2 C (97.2 F), Temp src: TEMPORAL Oxygen Therapy SpO2: 100 %, Heart Rate: 59, (03/01/18 0952)  .      Physical Exam   Constitutional: She is oriented to person, place, and time. Vital signs are normal. She appears well-developed and well-nourished. She does not appear  ill. No distress.   HENT:   Head: Normocephalic and atraumatic.   Right Ear: Hearing, tympanic membrane, external ear and ear canal normal.   Left Ear: Hearing, tympanic membrane, external ear and ear canal normal.   Nose: Mucosal edema present. Right sinus exhibits no maxillary sinus tenderness and no frontal sinus tenderness. Left sinus exhibits no maxillary sinus tenderness and no frontal sinus tenderness.   Mouth/Throat: Uvula is midline, oropharynx is clear and moist and mucous membranes are normal.   Eyes: Pupils are equal, round, and reactive to light. Conjunctivae, EOM and lids are normal.   Neck: Normal range of motion.   Cardiovascular: Normal rate, regular rhythm and normal heart sounds.    Pulmonary/Chest: Effort normal. She has decreased breath sounds in the right upper field, the right middle field, the right lower field, the left upper field, the left middle field and the left lower field. She has wheezes in the right upper field, the right middle field, the right lower field, the left upper field, the left middle field and the left lower field.   Expiratory wheezes throughout   Abdominal: Soft.    Musculoskeletal: Normal range of motion.   Lymphadenopathy:        Head (right side): No submental, no submandibular, no tonsillar and no preauricular adenopathy present.        Head (left side): No submental, no submandibular, no tonsillar and no preauricular adenopathy present.     She has no cervical adenopathy.   Neurological: She is alert and oriented to person, place, and time. Gait normal.   Skin: Skin is warm.   Psychiatric: She has a normal mood and affect. Her speech is normal and behavior is normal. Thought content normal. Cognition and memory are normal.   Nursing note and vitals reviewed.       Medical Decision Making        Initial Evaluation:  ED First Provider Contact     Date/Time Event User Comments    03/01/18 1005 ED First Provider Contact MariannaHORAN, Vianney Kopecky Initial Face to Face Provider Contact          Patient was seen on: 03/01/2018        Assessment:  26 y.o.female [facility] with with history of asthma, sounds like it's uncontrolled and she is using her albuterol inhaler daily with increased use over the past couple of days.  She does have expiratory wheezes and chest tightness with decreased breath sounds on lung auscultation.  Will give him 60 of prednisone, DuoNeb and reevaluate.    Patient felt significantly improved following prednisone and DuoNeb, lungs were clear to auscultation.  Going to bump patient up to Flovent daily as I believe her asthma is uncontrolled at this time.  She does have a pulmonology appointment set up for May or June of this year when she goes home for break.      Differential Diagnosis includes:  Viral bronchitis  URI  Nasopharyngitis  Nasosinusitis   Postnasal Drip  CAP   Seasonal allergies  Uncontrolled asthma      Plan:     Your asthma is not controlled, you should only be using your albuterol inhaler as needed, everyday is too much indicating that your asthma is not controlled    You were prescribed prednisone. Take your prednisone in the  morning with food, not right before bed.  Do not take other anti-inflammatory medications (Advil/ibuprofen, Aleve/naproxen, or Adult dose Aspirin) while taking prednisone.  Please continue use of your albuterol rescue inhaler every 4-6 hours as needed, for the next 2 days then taper and use only when needed    I have added Flovent, use twice daily as directed, please rinse your mouth after each use    Continue use of home humidifier overnight in your room.  Please continue to hydrate aggressively, and get plenty of rest.  You need to rest from strenuous or exertional/exacerbating activities for 5 days at minimum.    If your experience any new or evolving chest pains, worsening shortness of breath, coughing up blood, difficulty breathing, or any new fever/chills/lethargy, you should call your primary doctor, return to the urgent care or go to the emergency department for further evaluation and treatment.      Keep your appt with the pulmonologist          Final Diagnosis  Final diagnoses:   [J45.30] Uncontrolled mild persistent asthma (Primary)     Orders Placed This Encounter    albuterol (PROVENTIL) nebulization 2.5 mg    ipratropium (ATROVENT) 0.02 % nebulizer solution 500 mcg    predniSONE (DELTASONE) tablet 60 mg    predniSONE (DELTASONE) 20 MG tablet    albuterol HFA 108 (90 Base) MCG/ACT inhaler    fluticasone (FLOVENT DISKUS) 50 MCG/BLIST diskus inhaler       No results found for this or any previous visit (from the past 24 hour(s)).        Final Diagnosis    ICD-10-CM ICD-9-CM   1. Uncontrolled mild persistent asthma J45.30 493.90       Encourage fluids, encourage rest, good hand hygiene.    Use over the counter medications as discussed.    Please start the new medications as below:    Current Discharge Medication List          Please follow up with your physician as below:        Thank you Izza Bickle for coming to [facility] for your health care concerns.    If your condition changes and/or  worsens please follow up with her primary doctor and/or return to the urgent care center.    If short of breath, chest pains or any other concerns please report to the emergency room.    In the event of an Emergency dial 911.      Tomi Paddock Margy Clarks, PA              [facility] [employer], [place]  03/01/18 1115

## 2018-03-01 NOTE — ED Triage Notes (Signed)
"  My asthma has been had over the last two weeks, I'm waking up over night to use my inhaler"       Triage Note   Mackenzie Milchelia R Marinda Tyer, RN

## 2018-03-01 NOTE — Discharge Instructions (Signed)
Your asthma is not controlled, you should only be using your albuterol inhaler as needed, everyday is too much indicating that your asthma is not controlled    You were prescribed prednisone. Take your prednisone in the morning with food, not right before bed.  Do not take other anti-inflammatory medications (Advil/ibuprofen, Aleve/naproxen, or Adult dose Aspirin) while taking prednisone.    Please continue use of your albuterol rescue inhaler every 4-6 hours as needed, for the next 2 days then taper and use only when needed    I have added Flovent, use twice daily as directed, please rinse your mouth after each use    Continue use of home humidifier overnight in your room.  Please continue to hydrate aggressively, and get plenty of rest.  You need to rest from strenuous or exertional/exacerbating activities for 5 days at minimum.    If your experience any new or evolving chest pains, worsening shortness of breath, coughing up blood, difficulty breathing, or any new fever/chills/lethargy, you should call your primary doctor, return to the urgent care or go to the emergency department for further evaluation and treatment.      Keep your appt with the pulmonologist

## 2019-04-01 IMAGING — US US OB LIMITED
1 series · 14 of 25 positions shown · non-contrast
Comparison: none

CLINICAL DATA: Cramping for 3 months. No prenatal care. Estimated
gestational age by LMP is 28 weeks 6 days.

EXAM:
LIMITED OBSTETRIC ULTRASOUND

[Series 1: us ob limited · 0.23mm/px · 25 acquisitions, 14 frames shown]
[im 1/25]
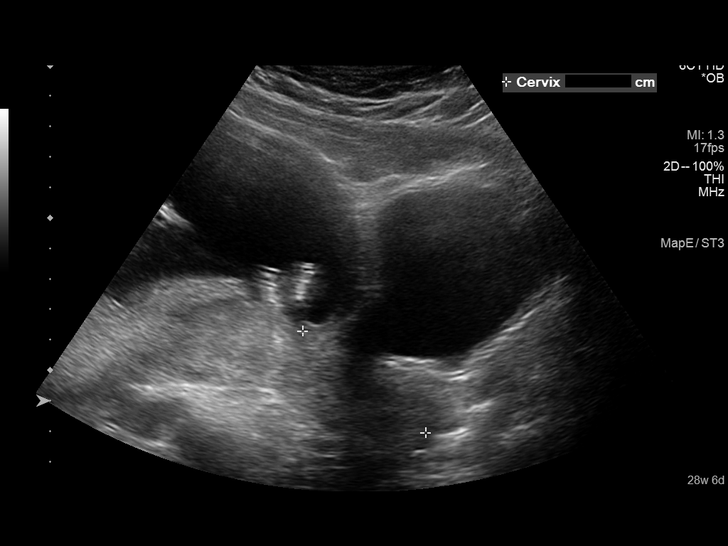
[im 3/25]
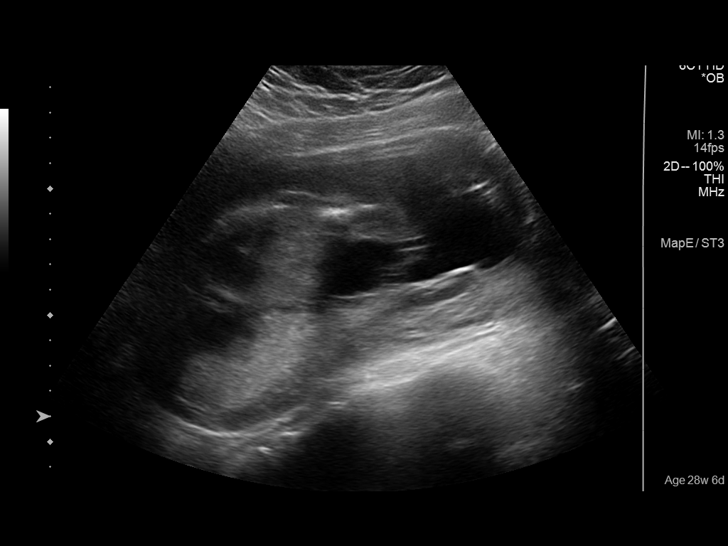
[im 5/25]
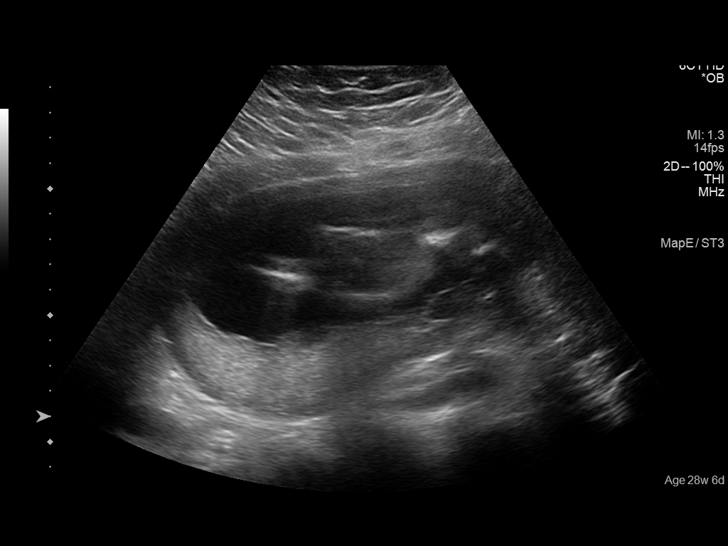
[im 7/25]
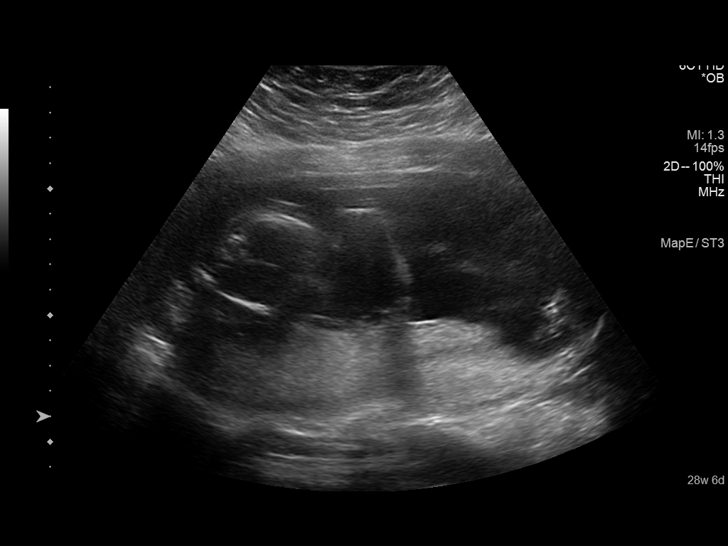
[im 9/25]
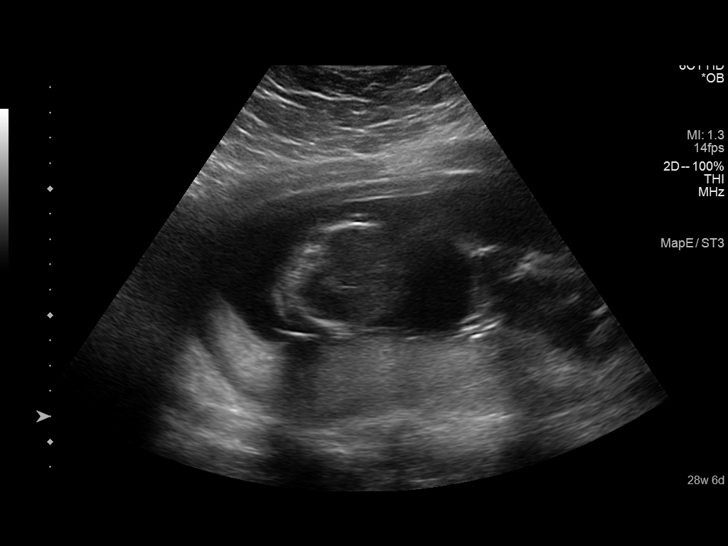
[im 10/25]
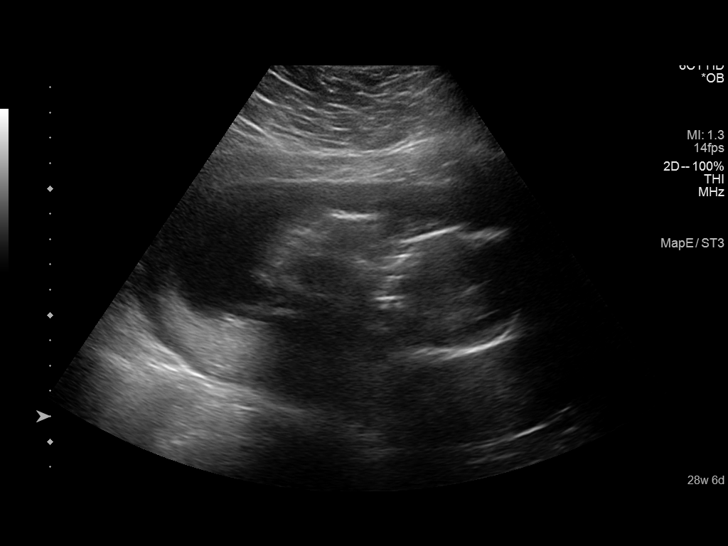
[im 12/25]
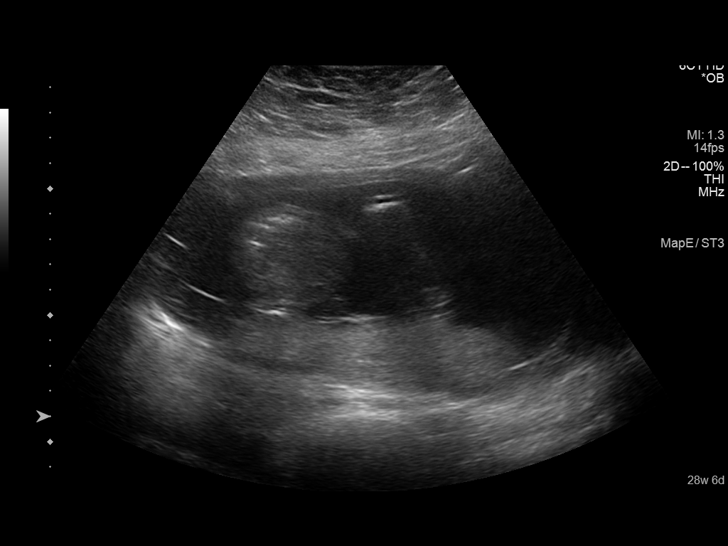
[im 14/25]
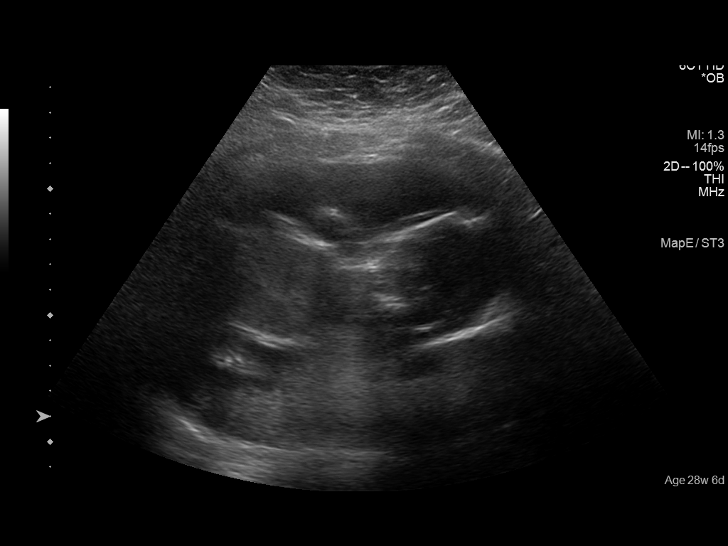
[im 16/25]
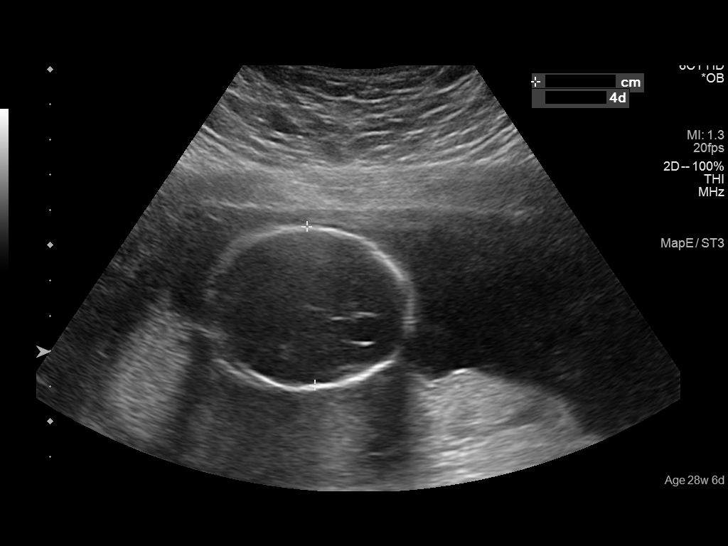
[im 17/25]
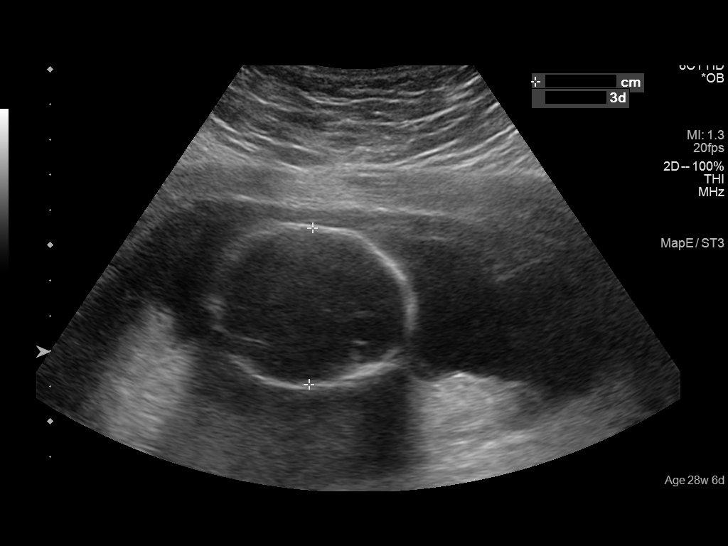
[im 19/25]
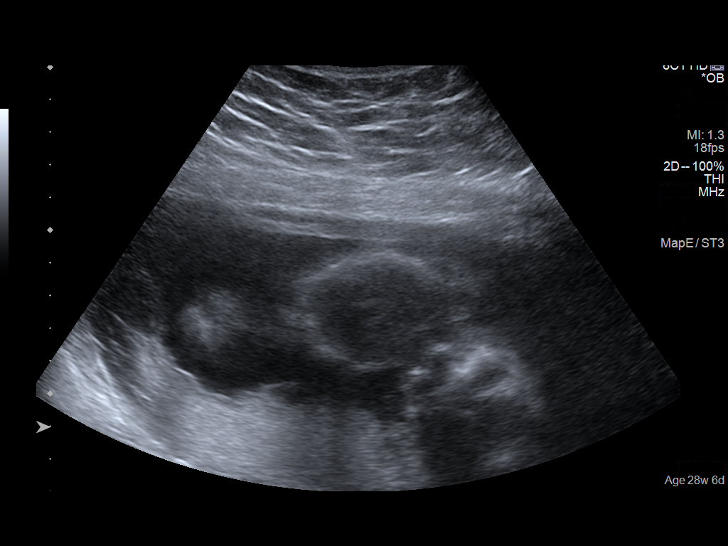
[im 21/25]
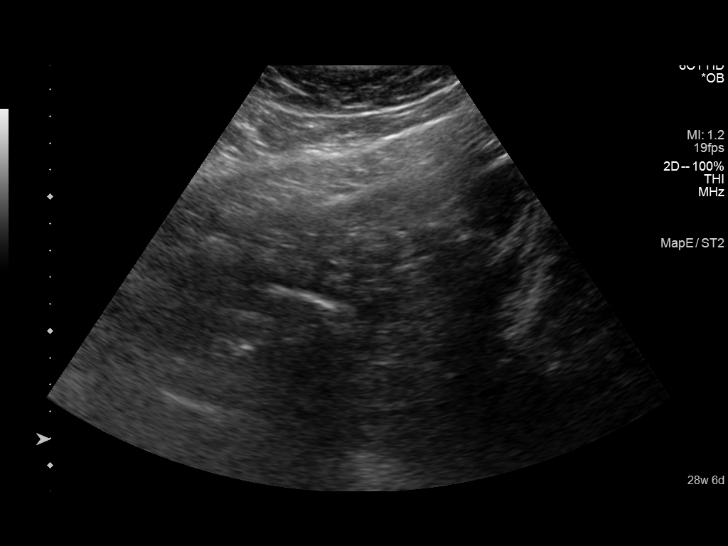
[im 23/25]
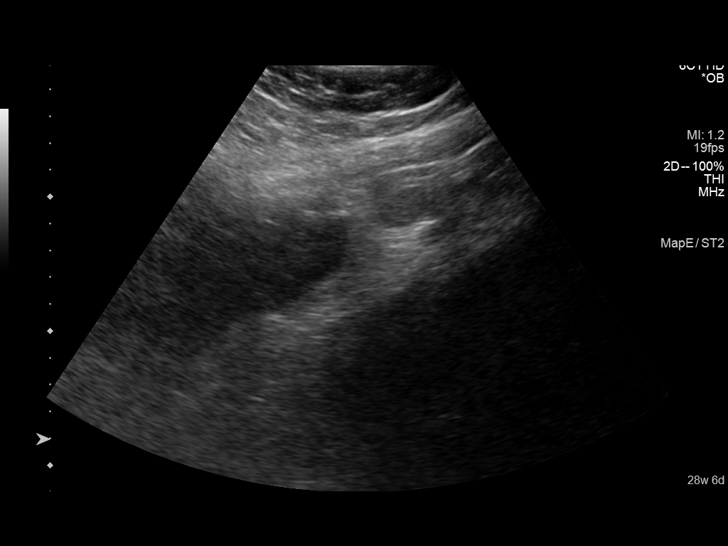
[im 25/25]
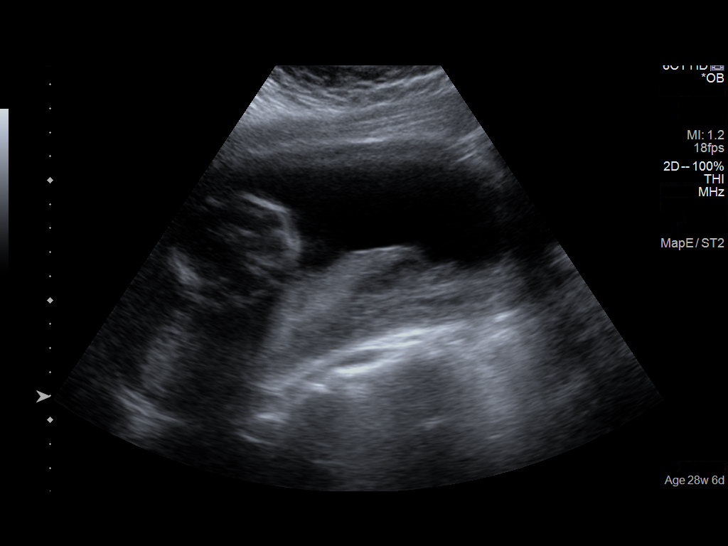

[14 of 25 positions shown; findings below may reference images not displayed]

FINDINGS: Number of Fetuses: 1

Heart Rate:  173 bpm

Movement: Fetal movement is observed.

Presentation: Transverse to the left.

Placental Location: Posterior

Previa: No. Inferior placental tip measures 5.6 cm from the cervical
os.

Amniotic Fluid (Subjective): Within normal limits. Largest vertical
pocket is measured at 4.8 cm.

BPD:  4.5cm 19w  4d

MATERNAL FINDINGS:

Cervix:  Cervix appears closed.  Cervical length measures 5.2 cm

Uterus/Adnexae: Limited visualization. No myometrial mass lesions
are identified. Ovaries are not identified but no adnexal masses are
shown in the images obtained.
IMPRESSION: Single intrauterine pregnancy with transverse lie. No acute
complication is demonstrated on limited ultrasound examination.

This exam is performed on an emergent basis and does not
comprehensively evaluate fetal size, dating, or anatomy; follow-up
complete OB US should be considered if further fetal assessment is
warranted.

## 8387-07-11 DEATH — deceased
